# Patient Record
Sex: Male | Born: 1995 | Hispanic: Yes | Marital: Single | Smoking: Never smoker
Health system: Southern US, Community
[De-identification: ages and names within clinical notes are randomized; demographics above are authoritative.]

## PROBLEM LIST (undated history)

## (undated) HISTORY — PX: APPENDECTOMY: SHX54

---
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
## 2014-01-04 ENCOUNTER — Ambulatory Visit (INDEPENDENT_AMBULATORY_CARE_PROVIDER_SITE_OTHER): Payer: Self-pay | Admitting: Emergency Medicine

## 2014-01-04 VITALS — BP 126/72 | HR 72 | Temp 98.3°F | Resp 18 | Ht 71.0 in | Wt 236.6 lb

## 2014-01-04 DIAGNOSIS — E663 Overweight: Secondary | ICD-10-CM

## 2014-01-04 LAB — BASIC METABOLIC PANEL
BUN: 12 mg/dL (ref 6–23)
CHLORIDE: 100 meq/L (ref 96–112)
CO2: 27 mEq/L (ref 19–32)
Calcium: 10 mg/dL (ref 8.4–10.5)
Creat: 0.88 mg/dL (ref 0.50–1.35)
GLUCOSE: 116 mg/dL — AB (ref 70–99)
POTASSIUM: 4.8 meq/L (ref 3.5–5.3)
Sodium: 137 mEq/L (ref 135–145)

## 2014-01-04 NOTE — Progress Notes (Signed)
[facility] [address], [place] [id number] [phone]  Date:  01/04/2014   Name:  Seth Meyer   DOB:  [birth year]   MRN:  [id number]  PCP:  No PCP Per Patient    Chief Complaint: Annual Exam   History of Present Illness:  Seth Meyer is a 18 y.o. 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 Steady weight.  Mom concerned he is overweight.  Strong family history of NIDDM. Denies other complaint or health concern today.  No weight loss or gain.  Steady appetite   There are no active problems to display for this patient.   History reviewed. No pertinent past medical history.  Past Surgical History  Procedure Laterality Date  . Appendectomy      History  Substance Use Topics  . Smoking status: Never Smoker   . Smokeless tobacco: Not on file  . Alcohol Use: No    Family History  Problem Relation Age of Onset  . Diabetes Father   . Hyperlipidemia Father     No Known Allergies  Medication list has been reviewed and updated.  No current outpatient prescriptions on file prior to visit.   No current facility-administered medications on file prior to visit.    Review of Systems:  As per HPI, otherwise negative. Marland Kitchen.  Physical Examination: Filed Vitals:   01/04/14 0951  BP: 126/72  Pulse: 72  Temp: 98.3 F (36.8 C)  Resp: 18   Filed Vitals:   01/04/14 0951  Height: 5\' 11"  (1.803 m)  Weight: 236 lb 9.6 oz (107.321 kg)   Body mass index is 33.01 kg/(m^2). Ideal Body Weight: Weight in (lb) to have BMI = 25: 178.9  GEN: WDWN, NAD, Non-toxic, A & O x 3 HEENT: Atraumatic, Normocephalic. Neck supple. No masses, No LAD. Ears and Nose: No external deformity. CV: RRR, No M/G/R. No JVD. No thrill. No extra heart sounds. PULM: CTA B, no wheezes, crackles, rhonchi. No retractions. No resp. distress. No accessory muscle use. ABD: S, NT, ND, +BS. No rebound. No  HSM. EXTR: No c/c/e NEURO Normal gait.  PSYCH: Normally interactive. Conversant. Not depressed or anxious appearing.  Calm demeanor.    Assessment and Plan: Wellness exam Labs pending   Signed,  Phillips OdorJeffery Mychaela Lennartz, MD

## 2014-01-05 LAB — TSH: TSH: 0.937 u[IU]/mL (ref 0.350–4.500)

## 2014-06-11 ENCOUNTER — Encounter (HOSPITAL_COMMUNITY): Payer: Self-pay | Admitting: *Deleted

## 2014-06-11 ENCOUNTER — Emergency Department (HOSPITAL_COMMUNITY)
Admission: EM | Admit: 2014-06-11 | Discharge: 2014-06-12 | Disposition: A | Discharge status: Home / Self Care | Payer: Medicaid Other | Attending: Emergency Medicine | Admitting: Emergency Medicine

## 2014-06-11 DIAGNOSIS — B354 Tinea corporis: Secondary | ICD-10-CM | POA: Insufficient documentation

## 2014-06-11 MED ORDER — CLOTRIMAZOLE 1 % EX CREA: TOPICAL_CREAM | CUTANEOUS | Status: AC

## 2014-06-11 NOTE — ED Notes (Signed)
Pt reports rash consisting of red bumps on all extremities. Pt states he was in the wood two weeks ago and noticed the rash two days after.

## 2014-06-11 NOTE — ED Provider Notes (Signed)
CSN: [id number]     Arrival date & time 06/11/14  2056 History  This chart was scribed for non-physician practitioner, Renne Crigler, working with Azalia Bilis, MD by Richarda Overlie, ED Scribe. This patient was seen in room TR05C/TR05C and the patient's care was started at 11:23 PM.  Chief Complaint  Patient presents with  . Rash   The history is provided by the patient. No language interpreter was used.   HPI Comments: Seth Meyer is a 19 y.o. male who presents to the Emergency Department complaining of a rash on his right medial calf for the last 2 weeks. Pt states it has now spread to his left and right forearms and right leg. He states that these new areas are itchy and reports he has been using benadryl ointment with relief. Pt states he he was in the woods two weeks ago but reports no known insect bites. He reports no pertinent past medical history at this time. He denies fever.   History reviewed. No pertinent past medical history. Past Surgical History  Procedure Laterality Date  . Appendectomy     Family History  Problem Relation Age of Onset  . Diabetes Father   . Hyperlipidemia Father    History  Substance Use Topics  . Smoking status: Never Smoker   . Smokeless tobacco: Not on file  . Alcohol Use: No    Review of Systems  Constitutional: Negative for fever.  HENT: Negative for facial swelling and trouble swallowing.   Eyes: Negative for redness.  Respiratory: Negative for shortness of breath, wheezing and stridor.   Cardiovascular: Negative for chest pain.  Gastrointestinal: Negative for nausea and vomiting.  Musculoskeletal: Negative for myalgias.  Skin: Positive for rash.  Neurological: Negative for light-headedness.  Psychiatric/Behavioral: Negative for confusion.    Allergies  Review of patient's allergies indicates no known allergies.  Home Medications   Prior to Admission medications   Not on File   BP 132/76 mmHg  Pulse 91  Temp(Src) 98.6 F  (37 C)  Resp 20  SpO2 97%   Physical Exam  Constitutional: He is oriented to person, place, and time. He appears well-developed and well-nourished.  HENT:  Head: Normocephalic and atraumatic.  Eyes: Conjunctivae are normal. Right eye exhibits no discharge. Left eye exhibits no discharge.  Neck: Normal range of motion. Neck supple. No tracheal deviation present.  Cardiovascular: Normal rate.   Pulmonary/Chest: Effort normal. No respiratory distress.  Abdominal: He exhibits no distension.  Neurological: He is alert and oriented to person, place, and time.  Skin: Skin is warm and dry. Rash noted.  Patient with 3 cm diameter circular area of crusting and central clearing noted to right medial ankle consistent with tinea corporis. Patient has other small papules scattered on extremities which he states are itchy. No drainage or discharge from any these areas.  Psychiatric: He has a normal mood and affect. His behavior is normal.  Nursing note and vitals reviewed.   ED Course  Procedures   DIAGNOSTIC STUDIES: Oxygen Saturation is 97% on RA, normal by my interpretation.    COORDINATION OF CARE: 11:27 PM Discussed treatment plan with pt at bedside and pt agreed to plan.   Labs Review Labs Reviewed - No data to display  Imaging Review No results found.   EKG Interpretation None       Vital signs reviewed and are as follows: Filed Vitals:   06/12/14 0013  BP: 123/58  Pulse: 86  Temp: 97.5 F (36.4 C)  Resp: 18   Patient to use Lotrimin cream on areas for the next several days. If this does not help he will transition to hydrocortisone cream. Patient to return to the emergency department with worsening.  MDM   Final diagnoses:  Tinea corporis   Patient with fungal rash versus allergic reaction. Area on right medial ankle consistent with tinea corporis. Patient has several smaller papules which may be the same but are much smaller. Plan as above. No signs of anaphylaxis.  No new skin exposures or medications.  I personally performed the services described in this documentation, which was scribed in my presence. The recorded information has been reviewed and is accurate.      Renne CriglerJoshua Karam Dunson, PA-C 06/12/14 0045  Azalia BilisKevin Campos, MD 06/12/14 [phone]

## 2014-06-11 NOTE — Discharge Instructions (Signed)
Please read and follow all provided instructions.  Your diagnoses today include:  1. Tinea corporis    Tests performed today include:  Vital signs. See below for your results today.   Medications prescribed:   Lotrimin - medication for ringworm rash  Take any prescribed medications only as directed.   Home care instructions:  If you do not have improvement this medication in 5 days, by some over-the-counter steroid cream and use as directed on the packaging. [facility] not [facility].   Follow-up instructions: Please follow-up with your primary care provider in the next 1 week for further evaluation of your symptoms.   Return instructions:  Return to the Emergency Department if you have:  Fever  Worsening symptoms  Worsening pain  Worsening swelling  Redness of the skin that moves away from the affected area, especially if it streaks away from the affected area   Any other emergent concerns  Your vital signs today were: BP 132/76 mmHg   Pulse 91   Temp(Src) 98.6 F (37 C)   Resp 20   SpO2 97% If your blood pressure (BP) was elevated above 135/85 this visit, please have this repeated by your doctor within one month. --------------

## 2020-02-25 ENCOUNTER — Other Ambulatory Visit: Payer: Self-pay

## 2020-02-25 ENCOUNTER — Ambulatory Visit (INDEPENDENT_AMBULATORY_CARE_PROVIDER_SITE_OTHER): Payer: Self-pay

## 2020-02-25 ENCOUNTER — Encounter (HOSPITAL_COMMUNITY): Payer: Self-pay | Admitting: Emergency Medicine

## 2020-02-25 ENCOUNTER — Ambulatory Visit (HOSPITAL_COMMUNITY)
Admission: EM | Admit: 2020-02-25 | Discharge: 2020-02-25 | Disposition: A | Discharge status: Home / Self Care | Payer: Self-pay | Attending: Family Medicine | Admitting: Family Medicine

## 2020-02-25 DIAGNOSIS — R1031 Right lower quadrant pain: Secondary | ICD-10-CM

## 2020-02-25 DIAGNOSIS — R1030 Lower abdominal pain, unspecified: Secondary | ICD-10-CM

## 2020-02-25 LAB — POCT URINALYSIS DIPSTICK, ED / UC
Bilirubin Urine: NEGATIVE
Glucose, UA: NEGATIVE mg/dL
Hgb urine dipstick: NEGATIVE
Ketones, ur: NEGATIVE mg/dL
Leukocytes,Ua: NEGATIVE
Nitrite: NEGATIVE
Protein, ur: NEGATIVE mg/dL
Specific Gravity, Urine: 1.02 (ref 1.005–1.030)
Urobilinogen, UA: 0.2 mg/dL (ref 0.0–1.0)
pH: 8.5 — ABNORMAL HIGH (ref 5.0–8.0)

## 2020-02-25 NOTE — ED Triage Notes (Signed)
Pt presents with right lower abdominal pain xs 2 weeks that is now radiating to the left side. States is a stinging pain that comes and goes. Denies diarrhea or vomiting or fever but c/o nausea.

## 2020-02-25 NOTE — Discharge Instructions (Addendum)
Your urine and x ray without any concerns or findings.  I would try doing ibuprofen 600 mg every 8 hours for the pain. If the pain continues or worsens I would go to the ER for a CT scan of the abdomen. Or if you develop fever, chills, nausea or vomiting.

## 2020-02-26 NOTE — ED Provider Notes (Signed)
[facility]    CSN: [id number] Arrival date & time: 02/25/20  1005      History   Chief Complaint Chief Complaint  Patient presents with  . Abdominal Pain    HPI Seth Meyer is a 24 y.o. male.   Pt is a 24 year old male who presents today with abdominal pain.  This is located to the lower abdomen.  Has been waxing waning over the past 2 weeks.  Describes as stinging pain.  Denies any associated nausea, vomiting, diarrhea, fevers.  History of appendectomy.  Has been mildly constipated but had bowel movement this morning.  No blood in stools.  No dysuria, hematuria or urinary frequency.  No concern for STDs     History reviewed. No pertinent past medical history.  There are no problems to display for this patient.   Past Surgical History:  Procedure Laterality Date  . APPENDECTOMY         Home Medications    Prior to Admission medications   Medication Sig Start Date End Date Taking? Authorizing Provider  clotrimazole (LOTRIMIN) 1 % cream Apply to affected area 2 times daily 06/11/14   Renne Crigler, PA-C    Family History Family History  Problem Relation Age of Onset  . Diabetes Father   . Hyperlipidemia Father     Social History Social History   Tobacco Use  . Smoking status: Never Smoker  . Smokeless tobacco: Never Used  Substance Use Topics  . Alcohol use: No  . Drug use: No     Allergies   Patient has no known allergies.   Review of Systems Review of Systems   Physical Exam Triage Vital Signs ED Triage Vitals  Enc Vitals Group     BP 02/25/20 1201 (!) 148/85     Pulse Rate 02/25/20 1201 (!) 106     Resp 02/25/20 1201 17     Temp 02/25/20 1201 98.6 F (37 C)     Temp Source 02/25/20 1201 Oral     SpO2 02/25/20 1201 98 %     Weight --      Height --      Head Circumference --      Peak Flow --      Pain Score 02/25/20 1158 3     Pain Loc --      Pain Edu? --      Excl. in GC? --    No data found.  Updated  Vital Signs BP (!) 148/85 (BP Location: Right Arm)   Pulse (!) 106   Temp 98.6 F (37 C) (Oral)   Resp 17   SpO2 98%   Visual Acuity Right Eye Distance:   Left Eye Distance:   Bilateral Distance:    Right Eye Near:   Left Eye Near:    Bilateral Near:     Physical Exam Vitals and nursing note reviewed.  Constitutional:      Appearance: Normal appearance.  HENT:     Head: Normocephalic and atraumatic.     Nose: Nose normal.  Eyes:     Conjunctiva/sclera: Conjunctivae normal.  Cardiovascular:     Rate and Rhythm: Normal rate and regular rhythm.  Pulmonary:     Effort: Pulmonary effort is normal.     Breath sounds: Normal breath sounds.  Abdominal:     General: Bowel sounds are decreased. There is no distension.     Palpations: Abdomen is soft.     Tenderness: There is abdominal tenderness. There  is no guarding or rebound.     Comments: Generalized lower abdominal tenderness   Musculoskeletal:        General: Normal range of motion.     Cervical back: Normal range of motion.  Skin:    General: Skin is warm and dry.  Neurological:     Mental Status: He is alert.  Psychiatric:        Mood and Affect: Mood normal.      UC Treatments / Results  Labs (all labs ordered are listed, but only abnormal results are displayed) Labs Reviewed  POCT URINALYSIS DIPSTICK, ED / UC - Abnormal; Notable for the following components:      Result Value   pH 8.5 (*)    All other components within normal limits    EKG   Radiology DG Abd 1 View  Result Date: 02/25/2020 CLINICAL DATA:  Right lower quadrant abdominal pain for 2 weeks. Constipation. EXAM: ABDOMEN - 1 VIEW COMPARISON:  None. FINDINGS: The bowel gas pattern is normal. No significant stool burden is noted. No radio-opaque calculi or other significant radiographic abnormality are seen. IMPRESSION: Negative. Electronically Signed   By: Lupita Raider M.D.   On: 02/25/2020 12:42    Procedures Procedures (including  critical care time)  Medications Ordered in UC Medications - No data to display  Initial Impression / Assessment and Plan / UC Course  I have reviewed the triage vital signs and the nursing notes.  Pertinent labs & imaging results that were available during my care of the patient were reviewed by me and considered in my medical decision making (see chart for details).     Lower abdominal pain Urine without infection. X-ray without any concerns or findings.  No significant stool burden. Pain most likely musculoskeletal versus possible adhesion from appendectomy. Recommended 600 of ibuprofen every 8 hours for pain. If the pain continues he will need to go to the ER for CT scan. Pt agreed  Final Clinical Impressions(s) / UC Diagnoses   Final diagnoses:  Lower abdominal pain     Discharge Instructions     Your urine and x ray without any concerns or findings.  I would try doing ibuprofen 600 mg every 8 hours for the pain. If the pain continues or worsens I would go to the ER for a CT scan of the abdomen. Or if you develop fever, chills, nausea or vomiting.      ED Prescriptions    None     PDMP not reviewed this encounter.   Janace Aris, NP 02/26/20 [phone]

## 2022-07-11 IMAGING — DX DG ABDOMEN 1V
2 series · 2 of 2 positions shown · non-contrast
Comparison: None.

CLINICAL DATA: Right lower quadrant abdominal pain for 2 weeks.
Constipation.

EXAM:
ABDOMEN - 1 VIEW

[abdomen kub (1 of 2)]
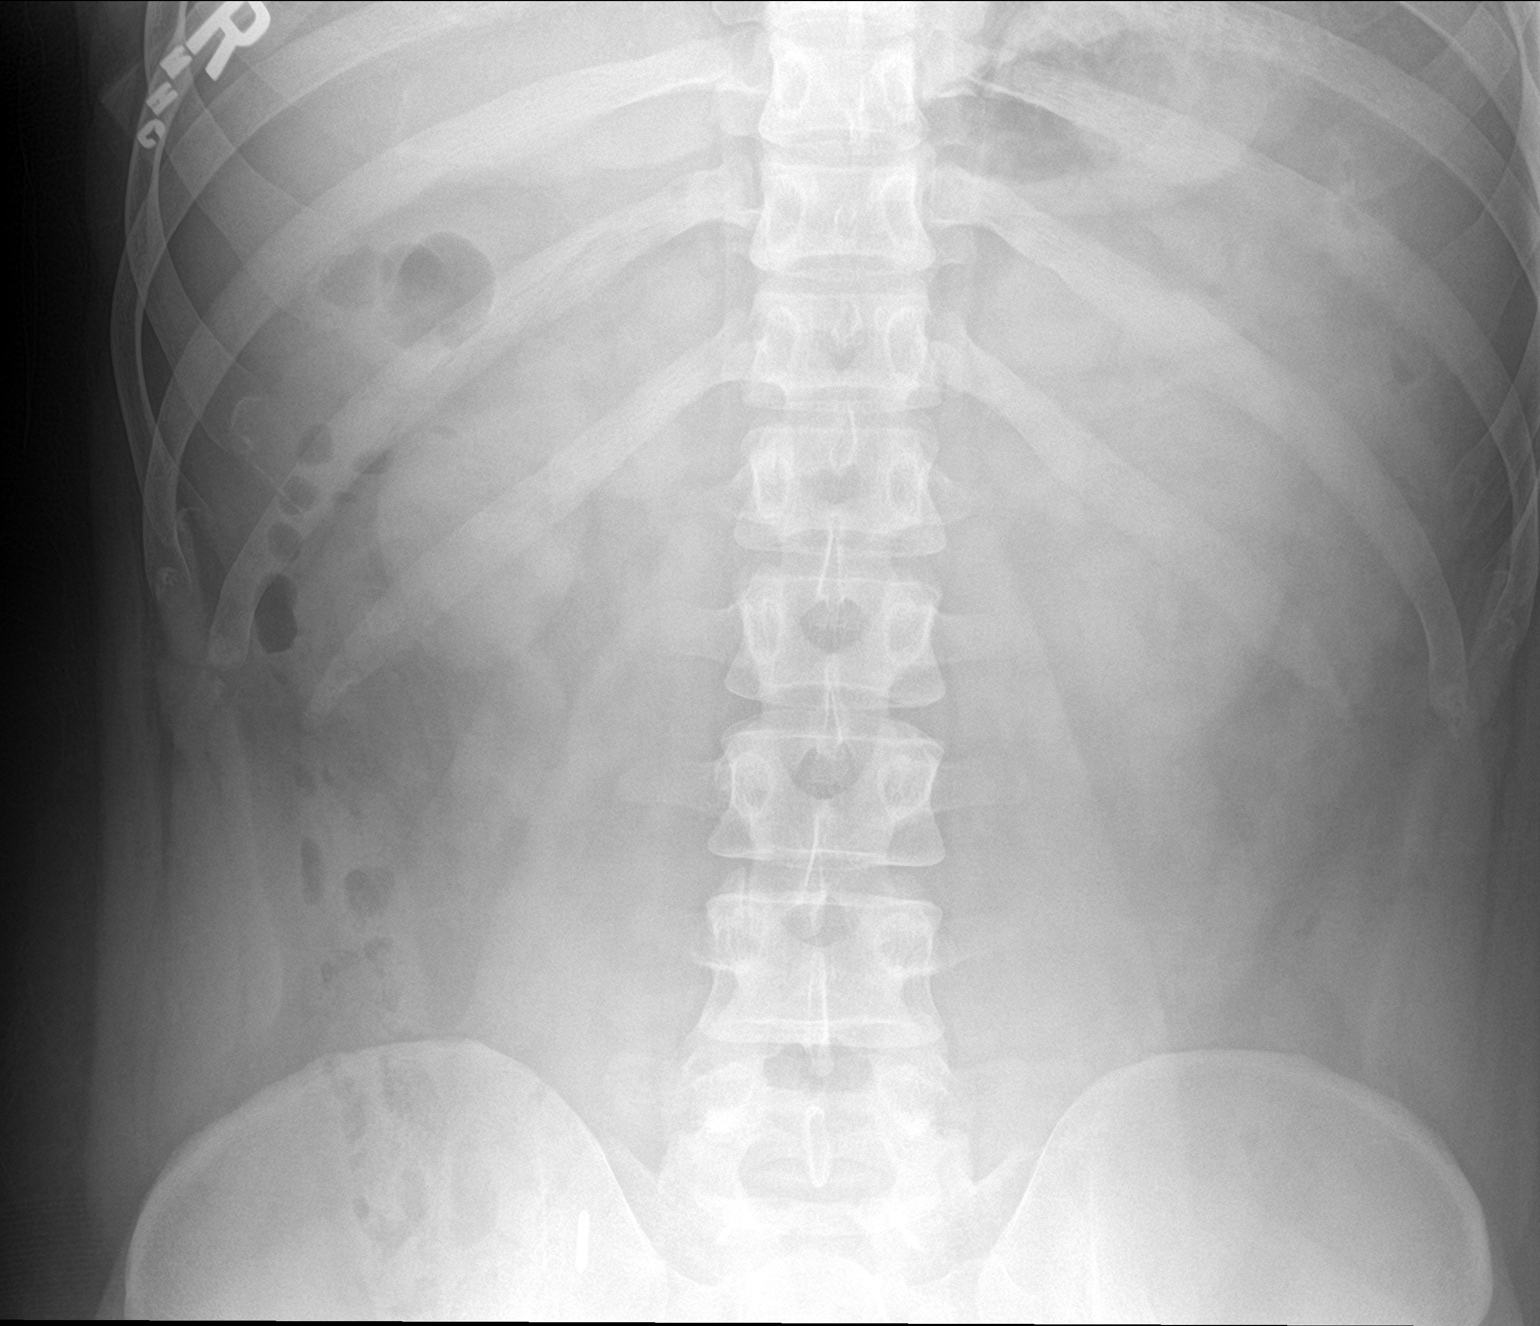

[abdomen kub (2 of 2)]
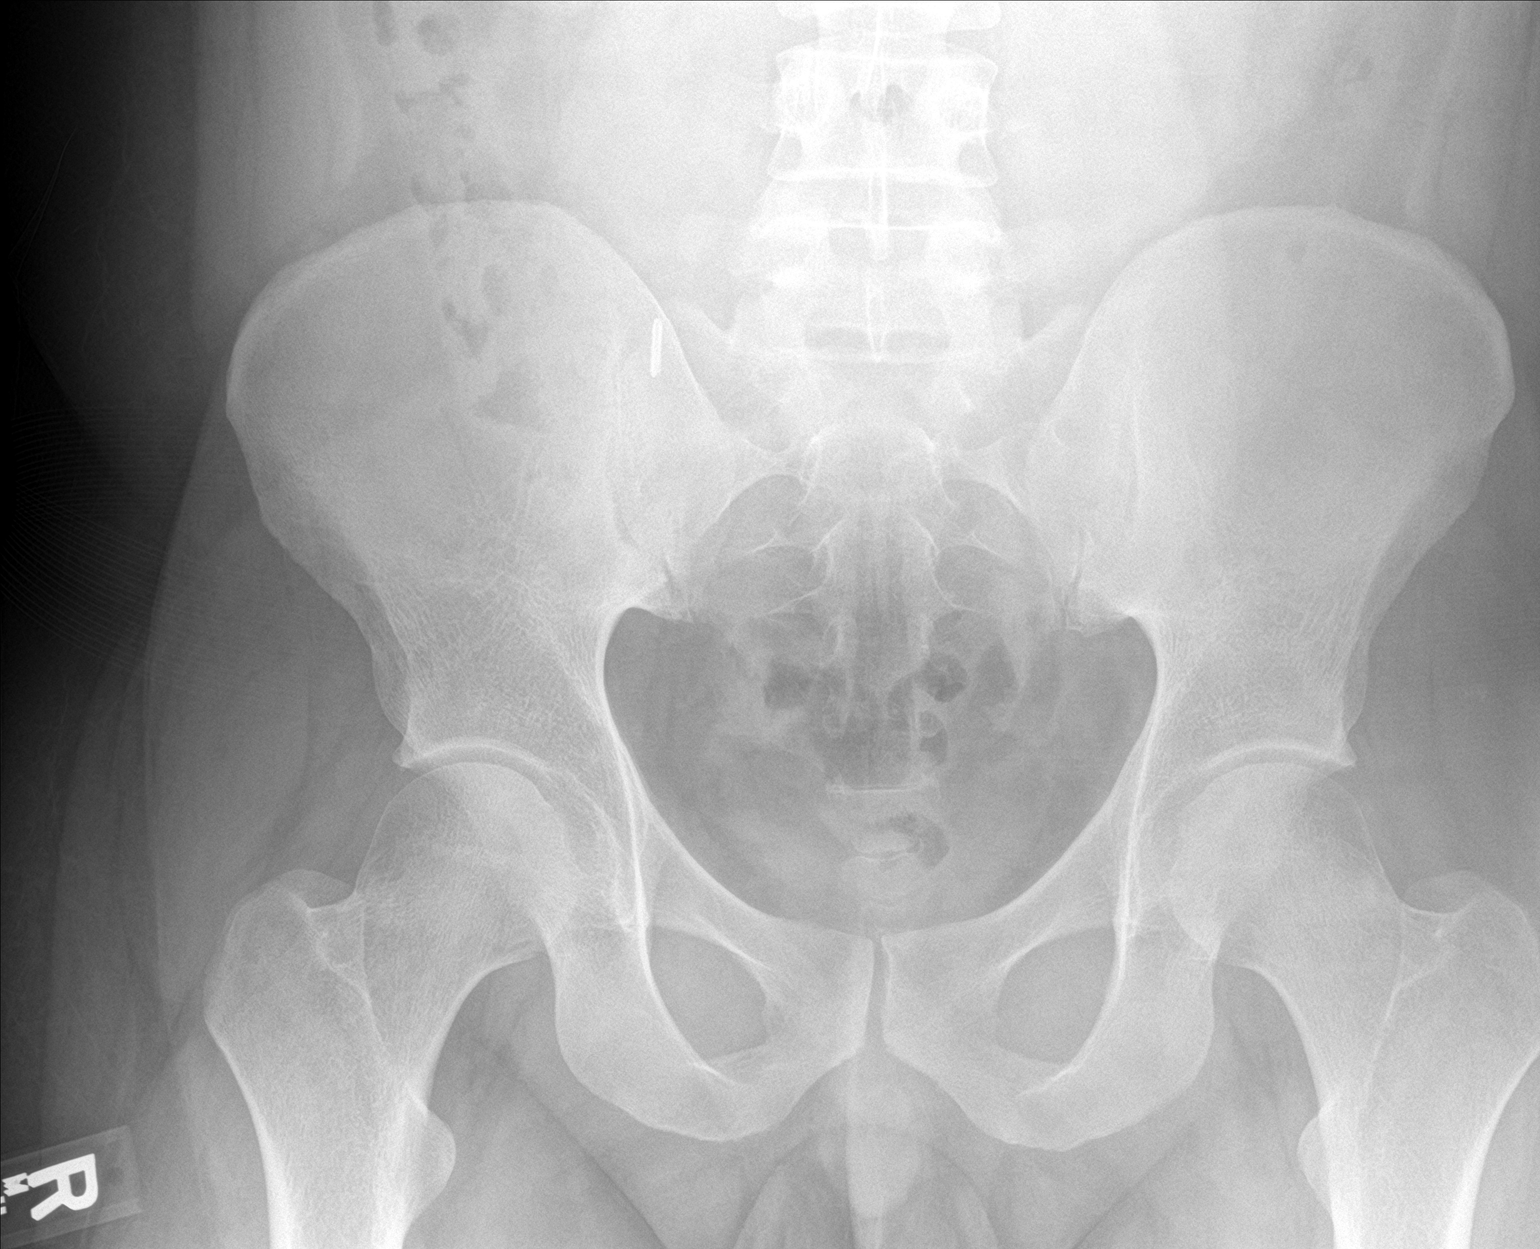

[2 of 2 positions shown; findings below may reference images not displayed]

FINDINGS: The bowel gas pattern is normal. No significant stool burden is
noted. No radio-opaque calculi or other significant radiographic
abnormality are seen.
IMPRESSION: Negative.
